# Patient Record
Sex: Male | Born: 1990 | Race: Black or African American | Hispanic: No | Marital: Single | Smoking: Current every day smoker
Health system: Southern US, Community
[De-identification: ages and names within clinical notes are randomized; demographics above are authoritative.]

## PROBLEM LIST (undated history)

## (undated) ENCOUNTER — Ambulatory Visit (HOSPITAL_COMMUNITY): Admission: EM | Payer: Self-pay | Source: Home / Self Care

---

## 2021-03-24 ENCOUNTER — Emergency Department (HOSPITAL_COMMUNITY)
Admission: EM | Admit: 2021-03-24 | Discharge: 2021-03-24 | Disposition: A | Discharge status: Home / Self Care | Payer: Self-pay | Attending: Emergency Medicine | Admitting: Emergency Medicine

## 2021-03-24 ENCOUNTER — Other Ambulatory Visit: Payer: Self-pay

## 2021-03-24 DIAGNOSIS — Z5321 Procedure and treatment not carried out due to patient leaving prior to being seen by health care provider: Secondary | ICD-10-CM | POA: Insufficient documentation

## 2021-03-24 DIAGNOSIS — K0889 Other specified disorders of teeth and supporting structures: Secondary | ICD-10-CM | POA: Insufficient documentation

## 2021-03-24 DIAGNOSIS — R600 Localized edema: Secondary | ICD-10-CM | POA: Insufficient documentation

## 2021-03-24 NOTE — ED Triage Notes (Signed)
C/O throat swelling and difficulty swallowing. Stated started 3 days ago.

## 2021-03-24 NOTE — ED Notes (Signed)
Pt stated he was leaving. 

## 2021-03-24 NOTE — ED Provider Notes (Signed)
Emergency Medicine Provider Triage Evaluation Note  Mitchell Archer , a 30 y.o. male  was evaluated in triage.  Pt complains of dental pain x3 days. Patient states a right lower posterior molar chipped a few days ago causing severe pain. Dental pain associated with lower jaw edema. Denies sore throat. No changes to phonation. Denies SOB.  Review of Systems  Positive: Dental pain Negative: SOB  Physical Exam  BP 119/64 (BP Location: Right Arm)   Pulse 97   Temp 99.2 F (37.3 C) (Oral)   Resp 17   Ht 5\' 11"  (1.803 m)   Wt 81.6 kg   SpO2 99%   BMI 25.10 kg/m  Gen:   Awake, no distress   Resp:  Normal effort  MSK:   Moves extremities without difficulty  Other:  Mild right-sided jaw edema. No abscess appreciated on exam. No trismus. Tolerating oral secretions without difficulty  Medical Decision Making  Medically screening exam initiated at 1:48 PM.  Appropriate orders placed.  Mitchell Archer was informed that the remainder of the evaluation will be completed by another provider, this initial triage assessment does not replace that evaluation, and the importance of remaining in the ED until their evaluation is complete.  Dental pain with facial edema.    Claud Kelp, PA-C 03/24/21 1350    03/26/21, MD 03/26/21 [phone]

## 2021-03-28 ENCOUNTER — Other Ambulatory Visit: Payer: Self-pay

## 2021-03-29 ENCOUNTER — Emergency Department (HOSPITAL_COMMUNITY): Payer: Self-pay

## 2021-03-29 ENCOUNTER — Other Ambulatory Visit: Payer: Self-pay

## 2021-03-29 ENCOUNTER — Encounter (HOSPITAL_COMMUNITY): Payer: Self-pay

## 2021-03-29 ENCOUNTER — Emergency Department (HOSPITAL_COMMUNITY)
Admission: EM | Admit: 2021-03-29 | Discharge: 2021-03-29 | Disposition: A | Discharge status: Home / Self Care | Payer: Self-pay | Attending: Emergency Medicine | Admitting: Emergency Medicine

## 2021-03-29 DIAGNOSIS — K047 Periapical abscess without sinus: Secondary | ICD-10-CM | POA: Insufficient documentation

## 2021-03-29 DIAGNOSIS — D72829 Elevated white blood cell count, unspecified: Secondary | ICD-10-CM | POA: Insufficient documentation

## 2021-03-29 LAB — CBC WITH DIFFERENTIAL/PLATELET
Abs Immature Granulocytes: 0.06 10*3/uL (ref 0.00–0.07)
Basophils Absolute: 0 10*3/uL (ref 0.0–0.1)
Basophils Relative: 0 %
Eosinophils Absolute: 0.1 10*3/uL (ref 0.0–0.5)
Eosinophils Relative: 1 %
HCT: 36.8 % — ABNORMAL LOW (ref 39.0–52.0)
Hemoglobin: 12 g/dL — ABNORMAL LOW (ref 13.0–17.0)
Immature Granulocytes: 0 %
Lymphocytes Relative: 8 %
Lymphs Abs: 1.2 10*3/uL (ref 0.7–4.0)
MCH: 24.1 pg — ABNORMAL LOW (ref 26.0–34.0)
MCHC: 32.6 g/dL (ref 30.0–36.0)
MCV: 74 fL — ABNORMAL LOW (ref 80.0–100.0)
Monocytes Absolute: 1 10*3/uL (ref 0.1–1.0)
Monocytes Relative: 7 %
Neutro Abs: 12 10*3/uL — ABNORMAL HIGH (ref 1.7–7.7)
Neutrophils Relative %: 84 %
Platelets: 345 10*3/uL (ref 150–400)
RBC: 4.97 MIL/uL (ref 4.22–5.81)
RDW: 17.7 % — ABNORMAL HIGH (ref 11.5–15.5)
WBC: 14.4 10*3/uL — ABNORMAL HIGH (ref 4.0–10.5)
nRBC: 0 % (ref 0.0–0.2)

## 2021-03-29 LAB — BASIC METABOLIC PANEL
Anion gap: 9 (ref 5–15)
BUN: 10 mg/dL (ref 6–20)
CO2: 27 mmol/L (ref 22–32)
Calcium: 9.3 mg/dL (ref 8.9–10.3)
Chloride: 106 mmol/L (ref 98–111)
Creatinine, Ser: 0.82 mg/dL (ref 0.61–1.24)
GFR, Estimated: >60 mL/min (ref >60)
Glucose, Bld: 135 mg/dL — ABNORMAL HIGH (ref 70–99)
Potassium: 4 mmol/L (ref 3.5–5.1)
Sodium: 142 mmol/L (ref 135–145)

## 2021-03-29 MED ORDER — MORPHINE SULFATE (PF) 4 MG/ML IV SOLN
4.0000 mg | Freq: Once | INTRAVENOUS | Status: AC
  Administered 2021-03-29: 4 mg via INTRAVENOUS
  Filled 2021-03-29: qty 1

## 2021-03-29 MED ORDER — ONDANSETRON HCL 4 MG/2ML IJ SOLN
4.0000 mg | Freq: Once | INTRAMUSCULAR | Status: AC
Start: 2021-03-29 — End: 2021-03-29
  Administered 2021-03-29: 4 mg via INTRAVENOUS
  Filled 2021-03-29: qty 2

## 2021-03-29 MED ORDER — HYDROCODONE-ACETAMINOPHEN 5-325 MG PO TABS: 1.0000 | ORAL_TABLET | ORAL | 0 refills | Status: DC | PRN

## 2021-03-29 MED ORDER — IOHEXOL 350 MG/ML SOLN
75.0000 mL | Freq: Once | INTRAVENOUS | Status: AC | PRN
  Administered 2021-03-29: 75 mL via INTRAVENOUS

## 2021-03-29 NOTE — ED Provider Notes (Signed)
[facility]-EMERGENCY DEPT Provider Note   CSN: [id number] Arrival date & time: 03/29/21  1748     History Chief Complaint  Patient presents with   Abscess    Mitchell Archer is a 30 y.o. male.  30 year old male with complaint of right lower dental pain with swelling.  Symptoms started 2 weeks ago, he started amoxicillin at that time and followed up with a dentist 2 days ago as his swelling seem to be getting progressively worse.  Dentist discontinue the amoxicillin and start clindamycin.  Patient had an x-ray showing a right lower molar infection with extension into his mandible.  Patient is scheduled for extraction in August and was advised to return to the emergency room for worsening concerns.  Patient is able to swallow without difficulty.  No fevers.  No other complaints or concerns.      History reviewed. No pertinent past medical history.  There are no problems to display for this patient.    No family history on file.     Home Medications Prior to Admission medications   Medication Sig Start Date End Date Taking? Authorizing Provider  clindamycin (CLEOCIN) 300 MG capsule Take 300 mg by mouth 3 (three) times daily. Start date:03/26/21 03/28/21  Yes [provider]  HYDROcodone-acetaminophen (NORCO/VICODIN) 5-325 MG tablet Take 1 tablet by mouth every 4 (four) hours as needed. 03/29/21  Yes Jeannie Fend, PA-C  ibuprofen (ADVIL) 200 MG tablet Take 800 mg by mouth every 4 (four) hours as needed for moderate pain.   Yes [provider]  amoxicillin-clavulanate (AUGMENTIN) 500-125 MG tablet Take 500 mg by mouth 3 (three) times daily. Start date :03/24/21    [provider]    Allergies    Patient has no known allergies.  Review of Systems   Review of Systems  Constitutional:  Negative for fever.  HENT:  Positive for dental problem and facial swelling. Negative for ear pain, trouble swallowing and voice change.    Gastrointestinal:  Negative for nausea and vomiting.  Musculoskeletal:  Negative for neck pain.  Skin:  Negative for rash and wound.  Allergic/Immunologic: Negative for immunocompromised state.  Neurological:  Negative for headaches.  Hematological:  Negative for adenopathy.  Psychiatric/Behavioral:  Negative for confusion.   All other systems reviewed and are negative.  Physical Exam Updated Vital Signs BP (!) 157/99 (BP Location: Right Arm)   Pulse 91   Temp 98.6 F (37 C) (Oral)   Resp 18   Ht 5\' 11"  (1.803 m)   Wt 81.6 kg   SpO2 97%   BMI 25.10 kg/m   Physical Exam Vitals and nursing note reviewed.  Constitutional:      General: He is not in acute distress.    Appearance: He is well-developed. He is not diaphoretic.  HENT:     Head: Normocephalic and atraumatic.     Jaw: No trismus.      Nose: Nose normal.     Mouth/Throat:     Mouth: Mucous membranes are moist.  Eyes:     Conjunctiva/sclera: Conjunctivae normal.  Pulmonary:     Effort: Pulmonary effort is normal.  Musculoskeletal:     Cervical back: Neck supple.  Lymphadenopathy:     Cervical: No cervical adenopathy.  Skin:    General: Skin is warm and dry.  Neurological:     Mental Status: He is alert and oriented to person, place, and time.  Psychiatric:        Behavior: Behavior  normal.    ED Results / Procedures / Treatments   Labs (all labs ordered are listed, but only abnormal results are displayed) Labs Reviewed  BASIC METABOLIC PANEL - Abnormal; Notable for the following components:      Result Value   Glucose, Bld 135 (*)    All other components within normal limits  CBC WITH DIFFERENTIAL/PLATELET - Abnormal; Notable for the following components:   WBC 14.4 (*)    Hemoglobin 12.0 (*)    HCT 36.8 (*)    MCV 74.0 (*)    MCH 24.1 (*)    RDW 17.7 (*)    Neutro Abs 12.0 (*)    All other components within normal limits    EKG None  Radiology CT Soft Tissue Neck W Contrast  Result  Date: 03/29/2021 CLINICAL DATA:  Cellulitis EXAM: CT NECK WITH CONTRAST TECHNIQUE: Multidetector CT imaging of the neck was performed using the standard protocol following the bolus administration of intravenous contrast. CONTRAST:  20mL OMNIPAQUE IOHEXOL 350 MG/ML SOLN COMPARISON:  None. FINDINGS: PHARYNX AND LARYNX: There is inflammatory change in right retromolar trigone. There is a fluid collection extending into the sublingual and submandibular space that measures 3.8 x 1.9 cm. SALIVARY GLANDS: Normal parotid, submandibular and sublingual glands. THYROID: Normal. LYMPH NODES: No enlarged or abnormal density lymph nodes. VASCULAR: Major cervical vessels are patent. LIMITED INTRACRANIAL: Normal. VISUALIZED ORBITS: Normal. MASTOIDS AND VISUALIZED PARANASAL SINUSES: Left maxillary retention cyst SKELETON: Periapical lucency of tooth 30 UPPER CHEST: Clear. OTHER: None. IMPRESSION: 1. Right retromolar trigone abscess extending into the sublingual and submandibular space, measuring 3.8 x 1.9 cm. 2. Periapical lucency of tooth 30. Electronically Signed   By: Deatra Robinson M.D.   On: 03/29/2021 21:45    Procedures Procedures   Medications Ordered in ED Medications  ondansetron [facility]) injection 4 mg (4 mg Intravenous Given 03/29/21 2102)  morphine 4 MG/ML injection 4 mg (4 mg Intravenous Given 03/29/21 2105)  iohexol (OMNIPAQUE) 350 MG/ML injection 75 mL (75 mLs Intravenous Contrast Given 03/29/21 2128)    ED Course  I have reviewed the triage vital signs and the nursing notes.  Pertinent labs & imaging results that were available during my care of the patient were reviewed by me and considered in my medical decision making (see chart for details).  Clinical Course as of 03/29/21 2237  Sat Mar 29, 2021  [id number] 30 year old male with right lower dental abscess.  On exam no trismus, normal mobility of the tongue, tolerating secretions with normal voice.  Labs with mildly elevated white blood cell count of  14,000, BMP unremarkable.  Patient had a CT showing a large abscess without extension into neck, does not show and is not suspicious for Ludwick's at this time. Discussed results with patient, stressed importance of return to ER for worsening symptoms otherwise recommend follow-up with his dentist on Monday.  Was given referral to oral surgery however available on-call provider is in [place], advised patient he may want to discuss a more local provider with his dentist.  Continue on his clindamycin, was given prescription for Norco. [LM]    Clinical Course User Index [LM] Alden Hipp   MDM Rules/Calculators/A&P                           Final Clinical Impression(s) / ED Diagnoses Final diagnoses:  Dental abscess    Rx / DC Orders ED Discharge Orders  Ordered    HYDROcodone-acetaminophen (NORCO/VICODIN) 5-325 MG tablet  Every 4 hours PRN        03/29/21 2212             Jeannie Fend, PA-C 03/29/21 2237    Linwood Dibbles, MD 03/31/21 [phone]

## 2021-03-29 NOTE — Discharge Instructions (Addendum)
Continue with Clindamycin as prescribed. Warm compresses to area for 20 minutes at a time. Rinse with Listerine after every meal.   Referral for oral surgery however recommend recheck with your dentist on Monday who may have a more local referral if needed.

## 2021-03-29 NOTE — ED Triage Notes (Addendum)
Pt came from home via POV. Pt reports tooth infection on right side of face on the bottom that started 2 weeks ago. Prescribed amoxicillin, without relief; dentist then prescribed clindamycin 2 days ago. Pt reports persistently worsening pain and swelling despite taking antibiotics and ibuprofen for pain. Pt also reports difficulty swallowing and vomiting and diarrhea due to being unable to eat with antibiotics. Pt reports the "infected tooth is rotted all the way down to the bone. My dentist said they were going to take the tooth out on 8/9 after the swelling and infection went down"

## 2021-03-29 NOTE — ED Provider Notes (Signed)
Emergency Medicine Provider Triage Evaluation Note  Mitchell Archer , a 30 y.o. male  was evaluated in triage.  Pt complains of worsening dental pain and facial swelling.  Patient states he has had problems with an infected right lower molar over the past 2 weeks, he was on amoxicillin without improvement, saw dentist 2 days prior who prescribed him clindamycin.  His symptoms continue to progressively worsen, states that the swelling and discomfort has extended to his neck.  He is also been having nausea, vomiting, and diarrhea from the antibiotic.  Marland Kitchen  Review of Systems  Positive: Dental pain, facial swelling, neck pain, vomiting, diarrhea Negative: Dyspnea, abdominal pain  Physical Exam  BP (!) 169/89 (BP Location: Right Arm)   Pulse 95   Temp 98.6 F (37 C) (Oral)   Resp 20   Ht 5\' 11"  (1.803 m)   Wt 81.6 kg   SpO2 95%   BMI 25.10 kg/m  Gen:   Awake, no distress  Resp:  Normal effort  MSK:   Moves extremities without difficulty  Other:  HEENT: Right posterior lower gingiva is swollen and tender to palpation.  Patient has external swelling and tenderness to the right jaw which extends to the submandibular space.  Uvula is midline.  He is tolerating his own secretions without difficulty.  Airway is patent currently.  Medical Decision Making  Medically screening exam initiated at 6:19 PM.  Appropriate orders placed.  Mitchell Archer was informed that the remainder of the evaluation will be completed by another provider, this initial triage assessment does not replace that evaluation, and the importance of remaining in the ED until their evaluation is complete.  Patient with infection of likely dental origin, concerned this is extending into the submandibular space.  Patient is currently protecting his airway without difficulty.  We will proceed with basic labs and CT soft tissue neck with contrast.  Dental pain.    Mitchell Archer 03/29/21 1826    03/31/21, MD 03/29/21  03/31/21

## 2021-04-04 ENCOUNTER — Other Ambulatory Visit: Payer: Self-pay

## 2021-04-04 ENCOUNTER — Ambulatory Visit
Admission: RE | Admit: 2021-04-04 | Discharge: 2021-04-04 | Disposition: A | Discharge status: Home / Self Care | Payer: Self-pay | Source: Ambulatory Visit | Attending: Physician Assistant | Admitting: Physician Assistant

## 2021-04-04 VITALS — BP 138/54 | HR 104 | Temp 99.3°F | Resp 18

## 2021-04-04 DIAGNOSIS — K047 Periapical abscess without sinus: Secondary | ICD-10-CM

## 2021-04-04 MED ORDER — CLINDAMYCIN HCL 300 MG PO CAPS: 300.0000 mg | ORAL_CAPSULE | Freq: Three times a day (TID) | ORAL | 0 refills | Status: AC

## 2021-04-04 NOTE — ED Provider Notes (Signed)
[name]    CSN: [id number] Arrival date & time: 04/04/21  1751      History   Chief Complaint Chief Complaint  Patient presents with   appt 6   Dental Pain    HPI Mitchell Archer is a 30 y.o. male.   Patient presents today with a several week history of right lower molar dental abscess.  Reports that he was seen in the emergency room on 03/29/2021 at which point he had already been seen twice for similar symptoms.  He was initially started on amoxicillin and took this entire course of medication without improvement of symptoms.  He was then switched to clindamycin but had continued symptoms he went to the emergency room on 03/29/2021 at which point he was noted to have mild leukocytosis with a white count of 14.0 and CT showed dental abscess without evidence of Ludwig angina.  He completed course of clindamycin and reports that symptoms have significantly improved but have not yet resolved.  He reports pain is rated 2 on a 0-10 pain scale, localized to right lower jaw without radiation, described as aching, worse with mastication, no alleviating factors identified.  He has been taking ibuprofen with improvement but not resolution of symptoms.  Denies any weakness, nausea, vomiting, difficulty swallowing, shortness of breath, changes in voice.  He is scheduled to see his dentist and have this tooth pulled on 04/15/2021 but thinks he needs additional antibiotics to completely resolve infection prior to this appointment.  He denies any significant side effects with clindamycin; had mild loose stools.   History reviewed. No pertinent past medical history.  There are no problems to display for this patient.   History reviewed. No pertinent surgical history.     Home Medications    Prior to Admission medications   Medication Sig Start Date End Date Taking? Authorizing Provider  clindamycin (CLEOCIN) 300 MG capsule Take 1 capsule (300 mg total) by mouth 3 (three) times daily.  04/04/21  Yes Arif Amendola K, PA-C  ibuprofen (ADVIL) 200 MG tablet Take 800 mg by mouth every 4 (four) hours as needed for moderate pain.    [provider]    Family History History reviewed. No pertinent family history.  Social History Social History   Tobacco Use   Smoking status: Every Day    Types: Cigarettes   Smokeless tobacco: Never  Substance Use Topics   Alcohol use: Yes   Drug use: Not Currently     Allergies   Patient has no known allergies.   Review of Systems Review of Systems  Constitutional:  Negative for activity change, appetite change, fatigue and fever.  HENT:  Positive for dental problem. Negative for congestion, sinus pressure, sneezing and sore throat.   Respiratory:  Negative for cough and shortness of breath.   Cardiovascular:  Negative for chest pain.  Gastrointestinal:  Negative for abdominal pain, diarrhea, nausea and vomiting.  Neurological:  Negative for dizziness, light-headedness and headaches.    Physical Exam Triage Vital Signs ED Triage Vitals  Enc Vitals Group     BP 04/04/21 1832 (!) 138/54     Pulse Rate 04/04/21 1832 (!) 104     Resp 04/04/21 1832 18     Temp 04/04/21 1832 99.3 F (37.4 C)     Temp Source 04/04/21 1832 Oral     SpO2 04/04/21 1832 96 %     Weight --      Height --      Head  Circumference --      Peak Flow --      Pain Score 04/04/21 1833 2     Pain Loc --      Pain Edu? --      Excl. in GC? --    No data found.  Updated Vital Signs BP (!) 138/54 (BP Location: Left Arm)   Pulse (!) 104   Temp 99.3 F (37.4 C) (Oral)   Resp 18   SpO2 96%   Visual Acuity Right Eye Distance:   Left Eye Distance:   Bilateral Distance:    Right Eye Near:   Left Eye Near:    Bilateral Near:     Physical Exam Vitals reviewed.  Constitutional:      General: He is awake.     Appearance: Normal appearance. He is normal weight. He is not ill-appearing.     Comments: Very pleasant male appears stated age  in no acute distress  HENT:     Head: Normocephalic and atraumatic.     Right Ear: Tympanic membrane, ear canal and external ear normal. Tympanic membrane is not erythematous or bulging.     Left Ear: Tympanic membrane, ear canal and external ear normal. Tympanic membrane is not erythematous or bulging.     Nose: Nose normal.     Mouth/Throat:     Dentition: Abnormal dentition. Dental tenderness and dental abscesses present.     Pharynx: Uvula midline. No oropharyngeal exudate or posterior oropharyngeal erythema.      Comments: No evidence of Ludwig angina Cardiovascular:     Rate and Rhythm: Normal rate and regular rhythm.     Heart sounds: Normal heart sounds, S1 normal and S2 normal. No murmur heard. Pulmonary:     Effort: Pulmonary effort is normal. No accessory muscle usage or respiratory distress.     Breath sounds: Normal breath sounds. No stridor. No wheezing, rhonchi or rales.     Comments: Clear to auscultation bilaterally Abdominal:     General: Bowel sounds are normal.     Palpations: Abdomen is soft.     Tenderness: There is no abdominal tenderness.  Lymphadenopathy:     Head:     Right side of head: No submental, submandibular or tonsillar adenopathy.     Left side of head: No submental, submandibular or tonsillar adenopathy.     Cervical: No cervical adenopathy.  Neurological:     Mental Status: He is alert.  Psychiatric:        Behavior: Behavior is cooperative.     UC Treatments / Results  Labs (all labs ordered are listed, but only abnormal results are displayed) Labs Reviewed - No data to display  EKG   Radiology No results found.  Procedures Procedures (including critical care time)  Medications Ordered in UC Medications - No data to display  Initial Impression / Assessment and Plan / UC Course  I have reviewed the triage vital signs and the nursing notes.  Pertinent labs & imaging results that were available during my care of the patient were  reviewed by me and considered in my medical decision making (see chart for details).      Persistent dental abscess noted on exam.  Patient was given an additional 5 days of clindamycin given previous course of Augmentin was not effective.  He was encouraged to alternate over-the-counter medication for symptom relief.  Discussed the importance of following up with dentist as they would likely have recurrent symptoms until broken tooth is  addressed.  Discussed that if he has any side effects with this medication including significant diarrhea he needs to stop the medication to be seen.  Discussed alarm symptoms that warrant emergent evaluation.  Strict return precautions given to which patient expressed understanding.  Final Clinical Impressions(s) / UC Diagnoses   Final diagnoses:  Dental abscess     Discharge Instructions      Take additional 5 days of clindamycin to hopefully resolve infection.  Continue alternating Tylenol and ibuprofen.  It is very important that you follow-up with your dentist as you will likely have recurrent symptoms until tooth is addressed.  If you have any worsening symptoms including swelling of your mouth/tongue, difficulty speaking, difficulty swallowing, shortness of breath you need to go to the emergency room as we discussed.  If you develop any severe diarrhea please stop the medication and be seen immediately.     ED Prescriptions     Medication Sig Dispense Auth. Provider   clindamycin (CLEOCIN) 300 MG capsule Take 1 capsule (300 mg total) by mouth 3 (three) times daily. 15 capsule Mahina Salatino K, PA-C      PDMP not reviewed this encounter.   Jeani Hawking, PA-C 04/04/21 1901

## 2021-04-04 NOTE — Discharge Instructions (Addendum)
Take additional 5 days of clindamycin to hopefully resolve infection.  Continue alternating Tylenol and ibuprofen.  It is very important that you follow-up with your dentist as you will likely have recurrent symptoms until tooth is addressed.  If you have any worsening symptoms including swelling of your mouth/tongue, difficulty speaking, difficulty swallowing, shortness of breath you need to go to the emergency room as we discussed.  If you develop any severe diarrhea please stop the medication and be seen immediately.

## 2021-04-04 NOTE — ED Triage Notes (Signed)
Pt states completed his antibiotics yesterday for a rt lower dental abscess. States the pain and swelling is better but not completely gone. States has an appt to have his tooth pulled on 8/09.

## 2022-07-18 IMAGING — CT CT NECK W/ CM
4 series · 14 of 33 positions shown, 17 images · IV contrast (omnipaque)
Comparison: None.

CLINICAL DATA: Cellulitis

EXAM:
CT NECK WITH CONTRAST
TECHNIQUE: Multidetector CT imaging of the neck was performed using the
standard protocol following the bolus administration of intravenous
contrast.
CONTRAST:  75mL OMNIPAQUE IOHEXOL 350 MG/ML SOLN

[Series 2: axial neck · axial · 0.49mm/px · z∈[-215,-55]mm · 5 of 120 slices shown, 7 images]
[im 20/120  soft-tissue]
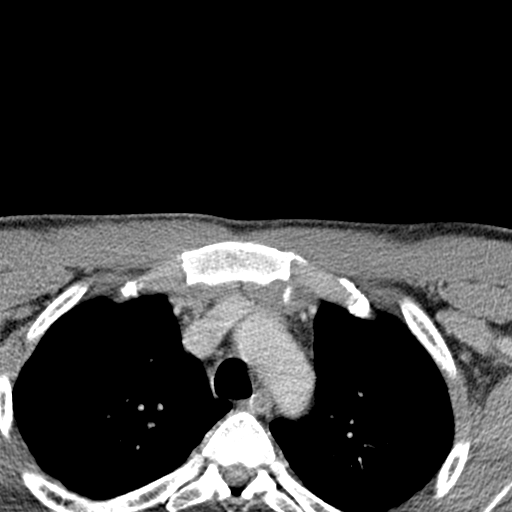
[im 20/120  bone]
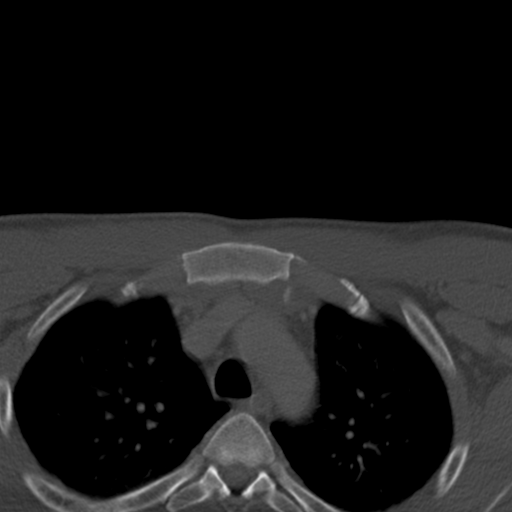
[im 40/120  bone]
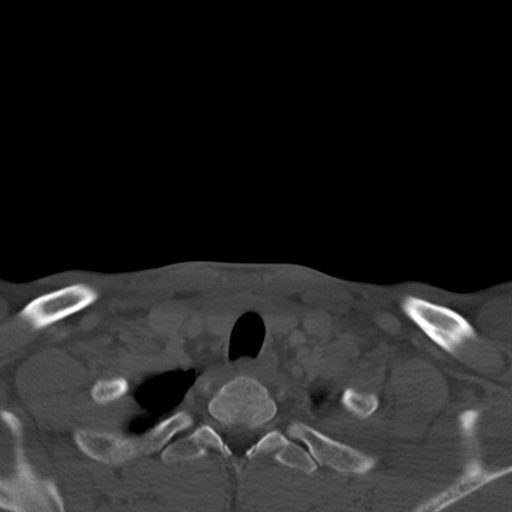
[im 60/120  bone]
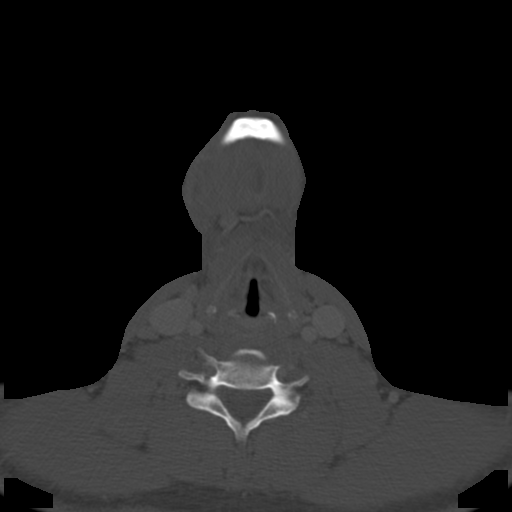
[im 80/120  bone]
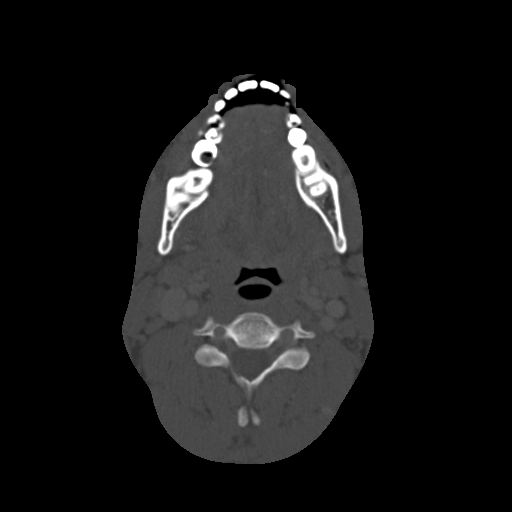
[im 100/120  soft-tissue]
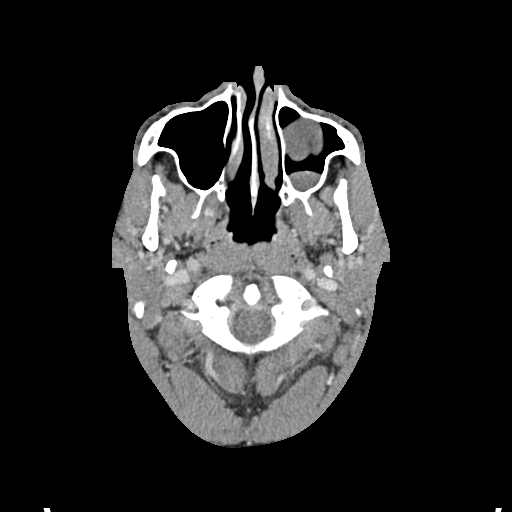
[im 100/120  bone]
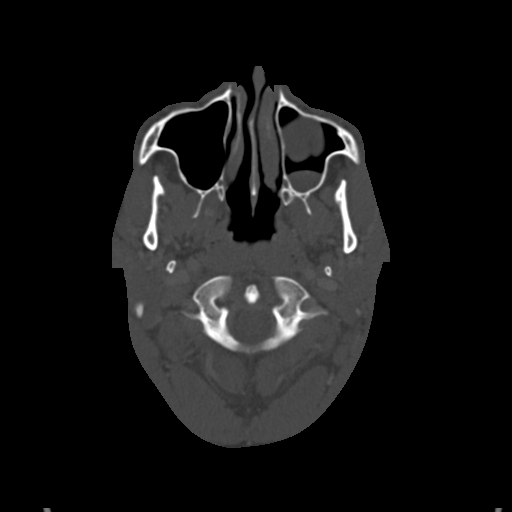

[Series 5: axial · axial · 0.39mm/px · 1 of 119 slices shown]
[im 20/119  bone]
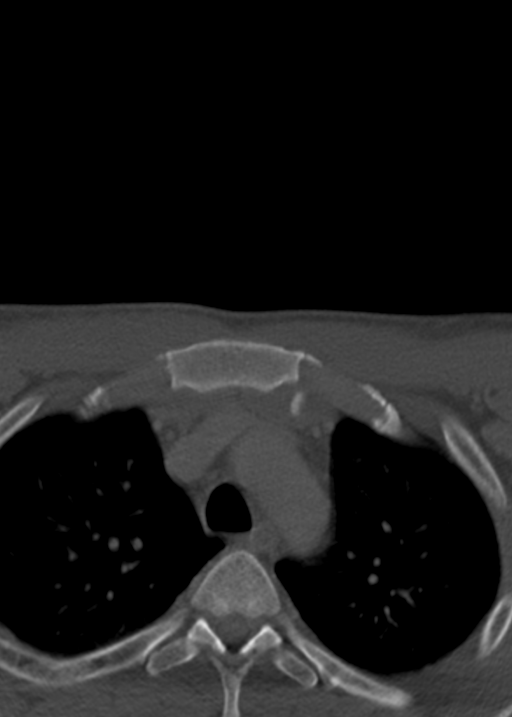

[Series 6: coronal · coronal · 0.39mm/px · 3 of 141 slices shown]
[im 29/141  bone]
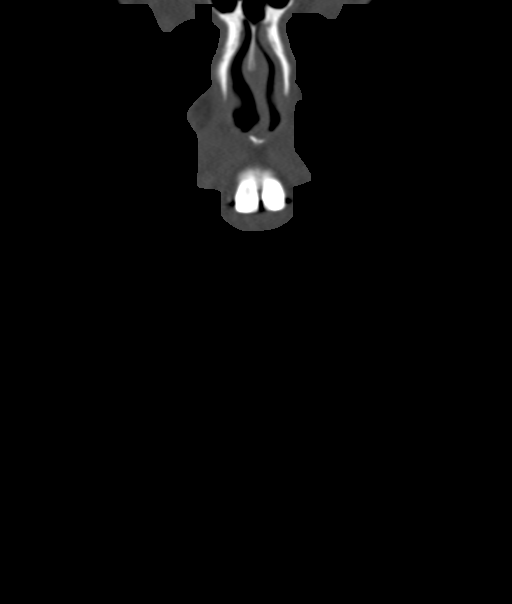
[im 57/141  bone]
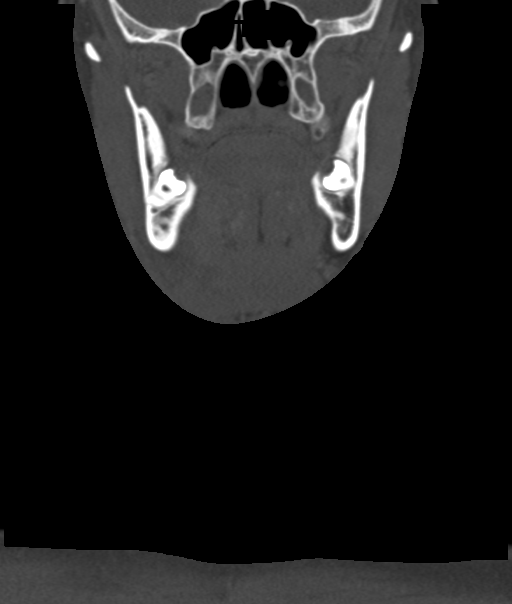
[im 85/141  bone]
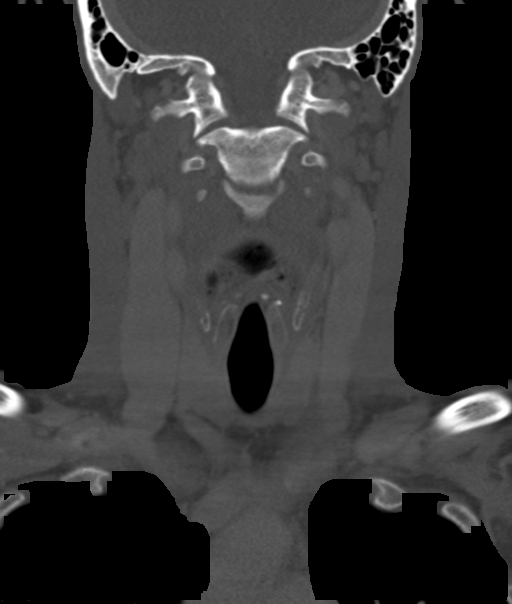

[Series 7: sagittal · sagittal · 0.46mm/px · 5 of 101 slices shown, 6 images]
[im 34/101  bone]
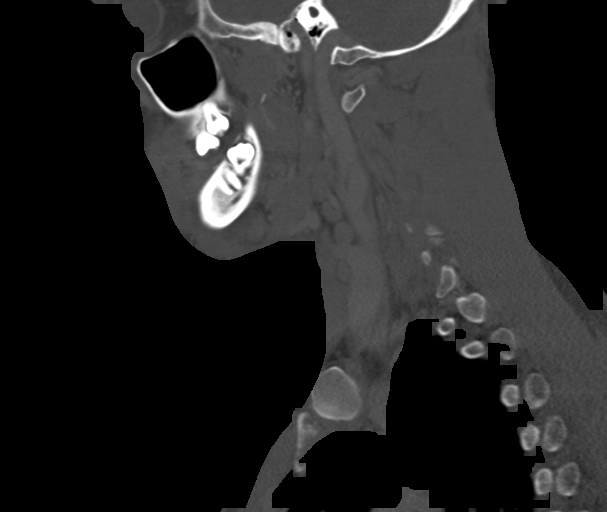
[im 42/101  bone]
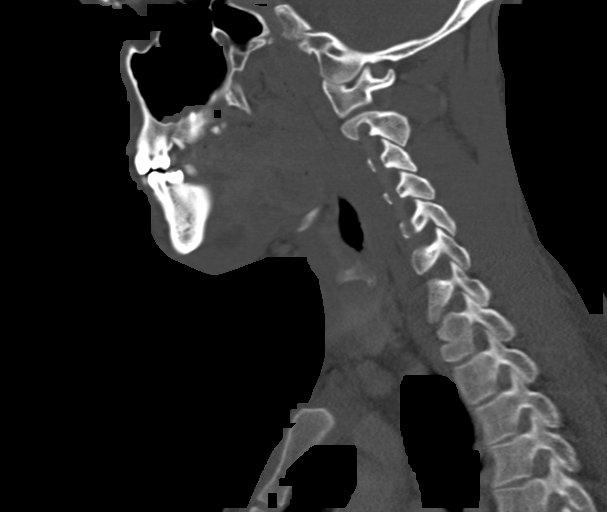
[im 51/101  soft-tissue]
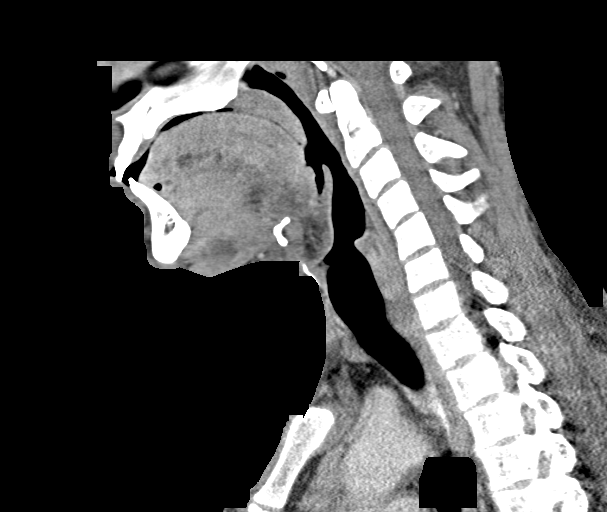
[im 51/101  bone]
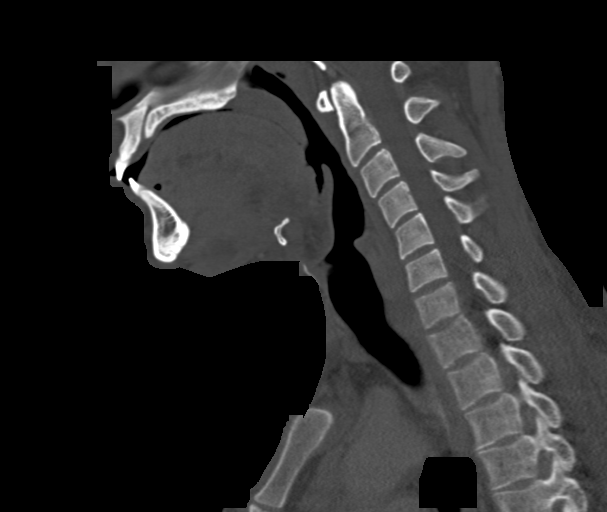
[im 59/101  bone]
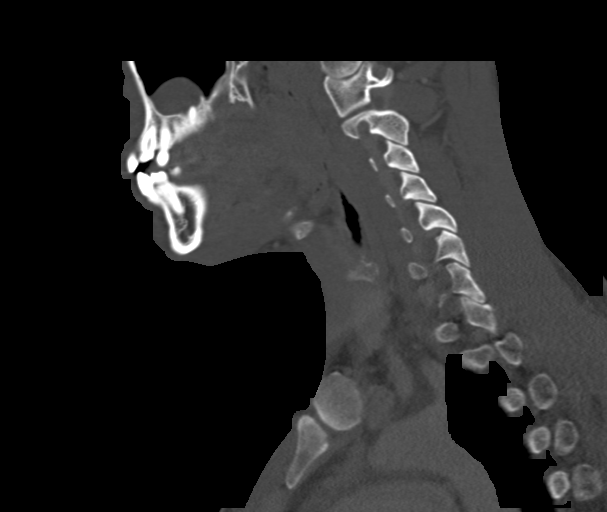
[im 67/101  bone]
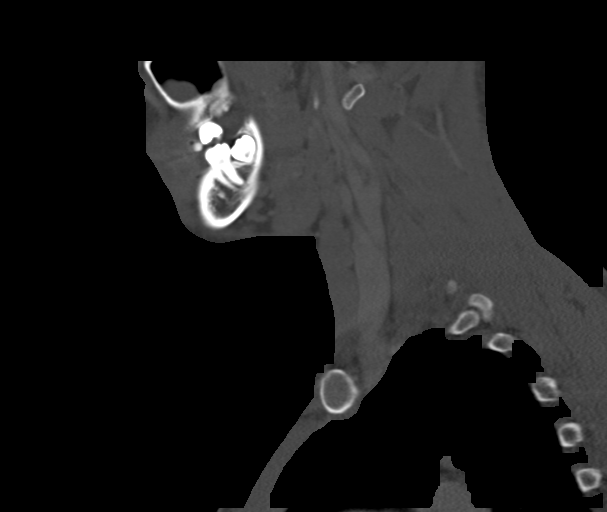

[14 of 33 positions shown; findings below may reference images not displayed]

FINDINGS: PHARYNX AND LARYNX: There is inflammatory change in right retromolar
trigone. There is a fluid collection extending into the sublingual
and submandibular space that measures 3.8 x 1.9 cm.

SALIVARY GLANDS: Normal parotid, submandibular and sublingual
glands.

THYROID: Normal.

LYMPH NODES: No enlarged or abnormal density lymph nodes.

VASCULAR: Major cervical vessels are patent.

LIMITED INTRACRANIAL: Normal.

VISUALIZED ORBITS: Normal.

MASTOIDS AND VISUALIZED PARANASAL SINUSES: Left maxillary retention
cyst

SKELETON: Periapical lucency of tooth 30

UPPER CHEST: Clear.

OTHER: None.
IMPRESSION: 1. Right retromolar trigone abscess extending into the sublingual
and submandibular space, measuring 3.8 x 1.9 cm.
2. Periapical lucency of tooth 30.
